# Patient Record
Sex: Male | Born: 2000 | Race: White | Hispanic: No | Marital: Single | Smoking: Never smoker
Health system: Southern US, Community
[De-identification: ages and names within clinical notes are randomized; demographics above are authoritative.]

---
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
## 2023-02-01 ENCOUNTER — Encounter (HOSPITAL_COMMUNITY): Payer: Self-pay

## 2023-02-01 ENCOUNTER — Emergency Department (HOSPITAL_COMMUNITY): Payer: Worker's Compensation

## 2023-02-01 ENCOUNTER — Emergency Department (HOSPITAL_COMMUNITY)
Admission: EM | Admit: 2023-02-01 | Discharge: 2023-02-02 | Disposition: A | Discharge status: Home / Self Care | Payer: Worker's Compensation | Attending: Emergency Medicine | Admitting: Emergency Medicine

## 2023-02-01 DIAGNOSIS — M79661 Pain in right lower leg: Secondary | ICD-10-CM | POA: Diagnosis not present

## 2023-02-01 DIAGNOSIS — Y990 Civilian activity done for income or pay: Secondary | ICD-10-CM | POA: Insufficient documentation

## 2023-02-01 DIAGNOSIS — M79662 Pain in left lower leg: Secondary | ICD-10-CM | POA: Insufficient documentation

## 2023-02-01 DIAGNOSIS — M25532 Pain in left wrist: Secondary | ICD-10-CM | POA: Diagnosis present

## 2023-02-01 DIAGNOSIS — Y92410 Unspecified street and highway as the place of occurrence of the external cause: Secondary | ICD-10-CM | POA: Insufficient documentation

## 2023-02-01 MED ORDER — IOHEXOL 300 MG/ML  SOLN
100.0000 mL | Freq: Once | INTRAMUSCULAR | Status: AC | PRN
  Administered 2023-02-01: 100 mL via INTRAVENOUS

## 2023-02-01 NOTE — ED Notes (Signed)
Attempted IV stick x 2  
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
